# Patient Record
Sex: Female | Born: 1955 | Race: Black or African American | Hispanic: No | Marital: Single | Smoking: Never smoker
Health system: Southern US, Community
[De-identification: ages and names within clinical notes are randomized; demographics above are authoritative.]

---

## 2010-05-08 ENCOUNTER — Ambulatory Visit: Admit: 2010-05-08 | Payer: Self-pay | Admitting: Nurse Practitioner

## 2010-06-06 ENCOUNTER — Encounter (INDEPENDENT_AMBULATORY_CARE_PROVIDER_SITE_OTHER): Payer: Self-pay | Admitting: Nurse Practitioner

## 2010-06-06 ENCOUNTER — Encounter: Payer: Self-pay | Admitting: Nurse Practitioner

## 2010-06-06 DIAGNOSIS — D239 Other benign neoplasm of skin, unspecified: Secondary | ICD-10-CM | POA: Insufficient documentation

## 2010-06-06 DIAGNOSIS — R42 Dizziness and giddiness: Secondary | ICD-10-CM | POA: Insufficient documentation

## 2010-06-06 DIAGNOSIS — K029 Dental caries, unspecified: Secondary | ICD-10-CM | POA: Insufficient documentation

## 2010-06-15 NOTE — Letter (Signed)
Summary: Handout Printed  Printed Handout:  - Nevus, Pigmented (Mole)

## 2010-06-15 NOTE — Letter (Signed)
Summary: DENTAL REFERRAL  DENTAL REFERRAL   Imported By: Arta Bruce 06/07/2010 10:26:08  _____________________________________________________________________  External Attachment:    Type:   Image     Comment:   External Document

## 2010-06-15 NOTE — Assessment & Plan Note (Signed)
Summary: NEW - Establish Care   Vital Signs:  Patient profile:   55 year old female Menstrual status:  postmenopausal Height:      65.50 inches Weight:      150.7 pounds BMI:     24.79 Temp:     97.6 degrees F oral Pulse rate:   72 / minute Pulse rhythm:   regular Resp:     16 per minute BP sitting:   126 / 78  (left arm) Cuff size:   regular  Vitals Entered By: Levon Hedger (June 06, 2010 12:05 PM) CC: new establish...mole on left breast that is getting bigger x 1 1/2 year....sometimes feels like she wants to faint she has a funny feeling in her head at that time. Is Patient Diabetic? No Pain Assessment Patient in pain? no       Does patient need assistance? Functional Status Self care Ambulation Normal     Menstrual Status postmenopausal   CC:  new establish...mole on left breast that is getting bigger x 1 1/2 year....sometimes feels like she wants to faint she has a funny feeling in her head at that time.Marland Kitchen  History of Present Illness:  Pt into the office to establish care. Pt is from [place] [address] [place].  She moved to [facility] in August 2009.  PMH - none PSH - s/p tonsillectomy  Mole on breast that has been present for many years. it has recently started to get larger which is a cause of concern for the pt.  She does do some some breast exams at home.  Habits & Providers  Alcohol-Tobacco-Diet     Alcohol drinks/day: <1     Alcohol Counseling: not indicated; use of alcohol is not excessive or problematic     Alcohol type: wine     Tobacco Status: quit     Tobacco Counseling: to remain off tobacco products     Year Quit: 1989  Exercise-Depression-Behavior     Does Patient Exercise: yes     Exercise Counseling: not indicated; exercise is adequate     Type of exercise: kickboxing     Exercise (avg: min/session): 30-60     Times/week: <3     Drug Use: never  Current Medications (verified): 1)  None  Family History: father -  htn mother-  alzhiemers brother - diabetes  Social History: 1 child single tobacco - quit  ETOH - wine socialy Drug - noneSmoking Status:  quit Drug Use:  never Does Patient Exercise:  yes  Review of Systems General:  Denies fever; +dizziness especially with walking.  admits that she does not drink as much fluid during the day as she should. denies any sneezing or congestion.  . Eyes:  +cataracts.  Physical Exam  General:  alert.   Head:  normocephalic.   Msk:  up to the exam table Neurologic:  alert & oriented X3.   Skin:  .9cm x .6cm nevus to left breast Psych:  Oriented X3.     Impression & Recommendations:  Problem # 1:  NEVUS (ICD-216.9) advised pt to monitor area handout given  Problem # 2:  DIZZINESS (ICD-780.4) advised pt to drink plenty of fluids will check cbc on next visit no allergy symptoms at this time  Other Orders: Dental Referral (Dentist)  Patient Instructions: 1)  Mole - read handout.  Keep an eye on this area and it will be measured again on your next visit 2)  Dizziness - main causes 3)  1.  Not enough hydration 4)  2.  Anemia (will check labs on next visit) 5)  3. Allergies 6)  Schedule an appointment for a complete physical exam.  No food after midnight before this visit.  You will get PAP, mammogram, labs, EKG, u/a   Orders Added: 1)  New Patient Level III [04540] 2)  Dental Referral [Dentist]

## 2010-07-17 ENCOUNTER — Other Ambulatory Visit (HOSPITAL_COMMUNITY): Payer: Self-pay | Admitting: Internal Medicine

## 2010-07-17 ENCOUNTER — Encounter (INDEPENDENT_AMBULATORY_CARE_PROVIDER_SITE_OTHER): Payer: Self-pay | Admitting: Nurse Practitioner

## 2010-07-17 ENCOUNTER — Encounter: Payer: Self-pay | Admitting: Nurse Practitioner

## 2010-07-17 ENCOUNTER — Other Ambulatory Visit: Payer: Self-pay | Admitting: Nurse Practitioner

## 2010-07-17 DIAGNOSIS — F329 Major depressive disorder, single episode, unspecified: Secondary | ICD-10-CM | POA: Insufficient documentation

## 2010-07-17 DIAGNOSIS — Z1231 Encounter for screening mammogram for malignant neoplasm of breast: Secondary | ICD-10-CM

## 2010-07-17 DIAGNOSIS — H6120 Impacted cerumen, unspecified ear: Secondary | ICD-10-CM | POA: Insufficient documentation

## 2010-07-17 DIAGNOSIS — F3289 Other specified depressive episodes: Secondary | ICD-10-CM | POA: Insufficient documentation

## 2010-07-17 LAB — CONVERTED CEMR LAB
Bilirubin Urine: NEGATIVE
Blood in Urine, dipstick: NEGATIVE
Glucose, Urine, Semiquant: NEGATIVE
Ketones, urine, test strip: NEGATIVE
Protein, U semiquant: NEGATIVE
Urobilinogen, UA: 0.2

## 2010-07-17 LAB — CYTOLOGY - PAP

## 2010-07-18 ENCOUNTER — Encounter (INDEPENDENT_AMBULATORY_CARE_PROVIDER_SITE_OTHER): Payer: Self-pay | Admitting: Nurse Practitioner

## 2010-07-18 LAB — CONVERTED CEMR LAB
AST: 17 units/L (ref 0–37)
Albumin: 4.7 g/dL (ref 3.5–5.2)
Alkaline Phosphatase: 50 units/L (ref 39–117)
BUN: 9 mg/dL (ref 6–23)
Basophils Relative: 0 % (ref 0–1)
Creatinine, Ser: 1.02 mg/dL (ref 0.40–1.20)
Eosinophils Absolute: 0.1 10*3/uL (ref 0.0–0.7)
Eosinophils Relative: 4 % (ref 0–5)
Glucose, Bld: 88 mg/dL (ref 70–99)
HCT: 39.3 % (ref 36.0–46.0)
Hemoglobin: 12.8 g/dL (ref 12.0–15.0)
Lymphs Abs: 1.7 10*3/uL (ref 0.7–4.0)
MCHC: 32.6 g/dL (ref 30.0–36.0)
MCV: 91.2 fL (ref 78.0–100.0)
Monocytes Absolute: 0.3 10*3/uL (ref 0.1–1.0)
Monocytes Relative: 7 % (ref 3–12)
Potassium: 4.1 meq/L (ref 3.5–5.3)
RBC: 4.31 M/uL (ref 3.87–5.11)
WBC: 4 10*3/uL (ref 4.0–10.5)

## 2010-07-19 ENCOUNTER — Encounter (INDEPENDENT_AMBULATORY_CARE_PROVIDER_SITE_OTHER): Payer: Self-pay | Admitting: Nurse Practitioner

## 2010-07-20 ENCOUNTER — Ambulatory Visit (HOSPITAL_COMMUNITY)
Admission: RE | Admit: 2010-07-20 | Discharge: 2010-07-20 | Disposition: A | Discharge status: Home / Self Care | Payer: Self-pay | Source: Ambulatory Visit | Attending: Internal Medicine | Admitting: Internal Medicine

## 2010-07-20 DIAGNOSIS — Z1231 Encounter for screening mammogram for malignant neoplasm of breast: Secondary | ICD-10-CM | POA: Insufficient documentation

## 2010-07-27 NOTE — Letter (Signed)
Summary: SOCIAL WORK ENCOUNTER  SOCIAL WORK ENCOUNTER   Imported By: Arta Bruce 07/20/2010 11:36:01  _____________________________________________________________________  External Attachment:    Type:   Image     Comment:   External Document

## 2010-07-27 NOTE — Progress Notes (Signed)
Summary: Office Visit//DEPRESSION SCREENING  Office Visit//DEPRESSION SCREENING   Imported By: Arta Bruce 07/20/2010 14:26:34  _____________________________________________________________________  External Attachment:    Type:   Image     Comment:   External Document

## 2010-07-27 NOTE — Letter (Signed)
Summary: *HSN Results Follow up  [facility]  [address] [place], [place] [address]   Phone: [phone]  Fax: [phone]      07/18/2010   Jessica Calhoun [address] [place], [place]  [address]   Dear  Jessica Calhoun,                            ____S.Drinkard,Calhoun   ____D. Gore,Calhoun       ____B. McPherson,MD   ____V. Rankins,MD    ____E. Mulberry,MD    __X__N. Daphine Deutscher, Calhoun  ____D. Reche Dixon, MD    ____K. Philipp Deputy, MD    ____Other     This letter is to inform you that your recent test(s):  _______Pap Smear    ___X____Lab Test     _______X-ray    ___X___ is within acceptable limits  _______ requires a medication change  _______ requires a follow-up lab visit  _______ requires a follow-up visit with your provider   Comments:  Labs done during recnet office visit are normal.       _________________________________________________________ If you have any questions, please contact our office [phone].                    Sincerely,    Jessica Calhoun [facility]

## 2010-07-27 NOTE — Letter (Signed)
Summary: TEST ORDER FORM//MAMMOGRAM  TEST ORDER FORM//MAMMOGRAM   Imported By: Arta Bruce 07/20/2010 16:17:20  _____________________________________________________________________  External Attachment:    Type:   Image     Comment:   External Document
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
## 2012-07-21 ENCOUNTER — Ambulatory Visit: Payer: Self-pay | Admitting: Internal Medicine

## 2013-06-11 ENCOUNTER — Ambulatory Visit: Payer: No Typology Code available for payment source | Attending: Internal Medicine

## 2013-06-22 ENCOUNTER — Emergency Department (HOSPITAL_COMMUNITY): Payer: No Typology Code available for payment source

## 2013-06-22 ENCOUNTER — Emergency Department (HOSPITAL_COMMUNITY)
Admission: EM | Admit: 2013-06-22 | Discharge: 2013-06-22 | Disposition: A | Discharge status: Home / Self Care | Payer: No Typology Code available for payment source | Attending: Emergency Medicine | Admitting: Emergency Medicine

## 2013-06-22 ENCOUNTER — Encounter (HOSPITAL_COMMUNITY): Payer: Self-pay | Admitting: Emergency Medicine

## 2013-06-22 DIAGNOSIS — Y92410 Unspecified street and highway as the place of occurrence of the external cause: Secondary | ICD-10-CM | POA: Insufficient documentation

## 2013-06-22 DIAGNOSIS — IMO0002 Reserved for concepts with insufficient information to code with codable children: Secondary | ICD-10-CM | POA: Insufficient documentation

## 2013-06-22 DIAGNOSIS — S90512A Abrasion, left ankle, initial encounter: Secondary | ICD-10-CM

## 2013-06-22 DIAGNOSIS — Y9389 Activity, other specified: Secondary | ICD-10-CM | POA: Insufficient documentation

## 2013-06-22 DIAGNOSIS — W19XXXA Unspecified fall, initial encounter: Secondary | ICD-10-CM

## 2013-06-22 DIAGNOSIS — Z79899 Other long term (current) drug therapy: Secondary | ICD-10-CM | POA: Insufficient documentation

## 2013-06-22 DIAGNOSIS — F17200 Nicotine dependence, unspecified, uncomplicated: Secondary | ICD-10-CM | POA: Insufficient documentation

## 2013-06-22 DIAGNOSIS — S8390XA Sprain of unspecified site of unspecified knee, initial encounter: Secondary | ICD-10-CM

## 2013-06-22 MED ORDER — TRAMADOL HCL 50 MG PO TABS: 50.0000 mg | ORAL_TABLET | Freq: Four times a day (QID) | ORAL | Status: AC | PRN

## 2013-06-22 NOTE — Discharge Instructions (Signed)
Follow up with your family md or dr. Tamera Punt if needed for your ankle and knee

## 2013-06-22 NOTE — ED Notes (Signed)
Patient transported to X-ray 

## 2013-06-22 NOTE — ED Notes (Signed)
Ace wrap placed on patients ankle. Pt instructed to place ice for 20 min 3-4 times a day for first 24 hours then alternate between ice and heat. Pt acknowledges discharge and instructions for use.
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
## 2013-07-07 ENCOUNTER — Emergency Department (INDEPENDENT_AMBULATORY_CARE_PROVIDER_SITE_OTHER): Payer: No Typology Code available for payment source

## 2013-07-07 ENCOUNTER — Encounter (HOSPITAL_COMMUNITY): Payer: Self-pay | Admitting: Emergency Medicine

## 2013-07-07 ENCOUNTER — Emergency Department (HOSPITAL_COMMUNITY)
Admission: EM | Admit: 2013-07-07 | Discharge: 2013-07-07 | Disposition: A | Discharge status: Home / Self Care | Payer: No Typology Code available for payment source | Source: Home / Self Care | Attending: Family Medicine | Admitting: Family Medicine

## 2013-07-07 DIAGNOSIS — M25569 Pain in unspecified knee: Secondary | ICD-10-CM

## 2013-07-07 DIAGNOSIS — S8253XA Displaced fracture of medial malleolus of unspecified tibia, initial encounter for closed fracture: Secondary | ICD-10-CM

## 2013-07-07 DIAGNOSIS — R791 Abnormal coagulation profile: Secondary | ICD-10-CM

## 2013-07-07 DIAGNOSIS — M7989 Other specified soft tissue disorders: Secondary | ICD-10-CM

## 2013-07-07 DIAGNOSIS — R7989 Other specified abnormal findings of blood chemistry: Secondary | ICD-10-CM

## 2013-07-07 DIAGNOSIS — X58XXXA Exposure to other specified factors, initial encounter: Secondary | ICD-10-CM

## 2013-07-07 LAB — POCT I-STAT, CHEM 8
BUN: 8 mg/dL (ref 6–23)
CREATININE: 1 mg/dL (ref 0.50–1.10)
Calcium, Ion: 1.17 mmol/L (ref 1.12–1.23)
Chloride: 103 mEq/L (ref 96–112)
Glucose, Bld: 90 mg/dL (ref 70–99)
HCT: 42 % (ref 36.0–46.0)
HEMOGLOBIN: 14.3 g/dL (ref 12.0–15.0)
POTASSIUM: 3.8 meq/L (ref 3.7–5.3)
SODIUM: 139 meq/L (ref 137–147)
TCO2: 24 mmol/L (ref 0–100)

## 2013-07-07 LAB — CBC WITH DIFFERENTIAL/PLATELET
BASOS PCT: 0 % (ref 0–1)
Basophils Absolute: 0 10*3/uL (ref 0.0–0.1)
Eosinophils Absolute: 0.2 10*3/uL (ref 0.0–0.7)
Eosinophils Relative: 4 % (ref 0–5)
HCT: 38.4 % (ref 36.0–46.0)
HEMOGLOBIN: 13.1 g/dL (ref 12.0–15.0)
Lymphocytes Relative: 49 % — ABNORMAL HIGH (ref 12–46)
Lymphs Abs: 2.1 10*3/uL (ref 0.7–4.0)
MCH: 31.1 pg (ref 26.0–34.0)
MCHC: 34.1 g/dL (ref 30.0–36.0)
MCV: 91.2 fL (ref 78.0–100.0)
MONOS PCT: 7 % (ref 3–12)
Monocytes Absolute: 0.3 10*3/uL (ref 0.1–1.0)
NEUTROS ABS: 1.7 10*3/uL (ref 1.7–7.7)
NEUTROS PCT: 40 % — AB (ref 43–77)
Platelets: 255 10*3/uL (ref 150–400)
RBC: 4.21 MIL/uL (ref 3.87–5.11)
RDW: 13 % (ref 11.5–15.5)
WBC: 4.2 10*3/uL (ref 4.0–10.5)

## 2013-07-07 LAB — D-DIMER, QUANTITATIVE (NOT AT ARMC): D DIMER QUANT: 0.79 ug{FEU}/mL — AB (ref 0.00–0.48)

## 2013-07-07 MED ORDER — TRAMADOL HCL 50 MG PO TABS: 50.0000 mg | ORAL_TABLET | Freq: Four times a day (QID) | ORAL | Status: AC | PRN
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
## 2013-07-07 NOTE — Discharge Instructions (Signed)
°  Ankle Fracture °A fracture is a break in the bone. A cast or splint is used to protect and keep your injured bone from moving.  °HOME CARE INSTRUCTIONS  °· Use your crutches as directed. °· To lessen the swelling, keep the injured leg elevated while sitting or lying down. °· Apply ice to the injury for 15-20 minutes, 03-04 times per day while awake for 2 days. Put the ice in a plastic bag and place a thin towel between the bag of ice and your cast. °· If you have a plaster or fiberglass cast: °· Do not try to scratch the skin under the cast using sharp or pointed objects. °· Check the skin around the cast every day. You may put lotion on any red or sore areas. °· Keep your cast dry and clean. °· If you have a plaster splint: °· Wear the splint as directed. °· You may loosen the elastic around the splint if your toes become numb, tingle, or turn cold or blue. °· Do not put pressure on any part of your cast or splint; it may break. Rest your cast only on a pillow the first 24 hours until it is fully hardened. °· Your cast or splint can be protected during bathing with a plastic bag. Do not lower the cast or splint into water. °· Take medications as directed by your caregiver. Only take over-the-counter or prescription medicines for pain, discomfort, or fever as directed by your caregiver. °· Do not drive a vehicle until your caregiver specifically tells you it is safe to do so. °· If your caregiver has given you a follow-up appointment, it is very important to keep that appointment. Not keeping the appointment could result in a chronic or permanent injury, pain, and disability. If there is any problem keeping the appointment, you must call back to this facility for assistance. °SEEK IMMEDIATE MEDICAL CARE IF:  °· Your cast gets damaged or breaks. °· You have continued severe pain or more swelling than you did before the cast was put on. °· Your skin or toenails below the injury turn blue or gray, or feel cold or  numb. °· There is a bad smell or new stains and/or purulent (pus like) drainage coming from under the cast. °If you do not have a window in your cast for observing the wound, a discharge or minor bleeding may show up as a stain on the outside of your cast. Report these findings to your caregiver. °MAKE SURE YOU:  °· Understand these instructions. °· Will watch your condition. °· Will get help right away if you are not doing well or get worse. °Document Released: 04/13/2000 Document Revised: 07/09/2011 Document Reviewed: 11/13/2012 °[employer]® Patient Information ©2014 [employer]. ° ° °
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
## 2013-07-08 ENCOUNTER — Emergency Department (HOSPITAL_COMMUNITY)
Admission: EM | Admit: 2013-07-08 | Discharge: 2013-07-08 | Discharge status: Absent without leave | Payer: No Typology Code available for payment source | Source: Home / Self Care | Attending: Family Medicine | Admitting: Family Medicine

## 2013-07-08 ENCOUNTER — Ambulatory Visit (HOSPITAL_COMMUNITY)
Admission: RE | Admit: 2013-07-08 | Discharge: 2013-07-08 | Disposition: A | Discharge status: Home / Self Care | Payer: No Typology Code available for payment source | Source: Ambulatory Visit | Attending: Family Medicine | Admitting: Family Medicine

## 2013-07-08 DIAGNOSIS — M7989 Other specified soft tissue disorders: Secondary | ICD-10-CM

## 2013-07-08 DIAGNOSIS — M79609 Pain in unspecified limb: Secondary | ICD-10-CM | POA: Insufficient documentation

## 2013-07-08 NOTE — ED Provider Notes (Signed)
Medical screening examination/treatment/procedure(s) were performed by a resident physician or non-physician practitioner and as the supervising physician I was immediately available for consultation/collaboration.  Lynne Leader, MD    Gregor Hams, MD 07/08/13 [phone]

## 2013-07-08 NOTE — Progress Notes (Signed)
*  Preliminary Results* Bilateral lower extremity venous duplex completed. Bilateral lower extremities are negative for deep vein thrombosis. There is no evidence of Baker's cyst bilaterally.  07/08/2013  Maudry Mayhew, RVT, RDCS, RDMS

## 2013-07-10 ENCOUNTER — Telehealth: Payer: Self-pay | Admitting: General Practice

## 2013-07-10 NOTE — Telephone Encounter (Signed)
Returning pt's call regarding scheduling appt.
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
